# Patient Record
Sex: Male | Born: 1985 | Race: Black or African American | Hispanic: No | Marital: Married | Smoking: Current some day smoker
Health system: Southern US, Community
[De-identification: ages and names within clinical notes are randomized; demographics above are authoritative.]

## PROBLEM LIST (undated history)

## (undated) HISTORY — PX: KNEE SURGERY: SHX244

---

## 2019-01-09 ENCOUNTER — Emergency Department (HOSPITAL_COMMUNITY)

## 2019-01-09 ENCOUNTER — Other Ambulatory Visit: Payer: Self-pay

## 2019-01-09 ENCOUNTER — Encounter (HOSPITAL_COMMUNITY): Payer: Self-pay

## 2019-01-09 ENCOUNTER — Emergency Department (HOSPITAL_COMMUNITY)
Admission: EM | Admit: 2019-01-09 | Discharge: 2019-01-09 | Disposition: A | Discharge status: Home / Self Care | Attending: Emergency Medicine | Admitting: Emergency Medicine

## 2019-01-09 DIAGNOSIS — Z720 Tobacco use: Secondary | ICD-10-CM | POA: Diagnosis not present

## 2019-01-09 DIAGNOSIS — J301 Allergic rhinitis due to pollen: Secondary | ICD-10-CM | POA: Insufficient documentation

## 2019-01-09 DIAGNOSIS — R0981 Nasal congestion: Secondary | ICD-10-CM | POA: Diagnosis present

## 2019-01-09 DIAGNOSIS — R0600 Dyspnea, unspecified: Secondary | ICD-10-CM | POA: Diagnosis not present

## 2019-01-09 LAB — GROUP A STREP BY PCR: Group A Strep by PCR: NOT DETECTED

## 2019-01-09 NOTE — ED Provider Notes (Signed)
[facility]-EMERGENCY DEPT Provider Note   CSN: [id number] Arrival date & time: 01/09/19  1041    History   Chief Complaint Chief Complaint  Patient presents with  . Nasal Congestion  . Allergies    HPI Adam West is a 33 y.o. male.     Patient is a 33 year old male with past medical history of seasonal allergies who presents in the emergency department for shortness of breath and nasal congestion.  Patient reports that about 1 week ago he began to feel short of breath with exertion.  Reports that he has been having his usual seasonal allergy symptoms for the last month or 2.  Reports he takes daily Allegra for this.  Reports nasal congestion with some sneezing and dry cough.  He denies any sick contacts but does report that he traveled to [place] recently.  He reports that he just wanted to get checked out in the context of the coronavirus pandemic.  Denies any fever, chest pain, leg swelling, leg pain.  He reports that the he feels this is probably just his allergies and he might be overreacting but wanted to be evaluated anyway     History reviewed. No pertinent past medical history.  There are no active problems to display for this patient.   Past Surgical History:  Procedure Laterality Date  . KNEE SURGERY Right         Home Medications    Prior to Admission medications   Not on File    Family History No family history on file.  Social History Social History   Tobacco Use  . Smoking status: Current Some Day Smoker  Substance Use Topics  . Alcohol use: Yes    Comment: socially  . Drug use: Yes    Types: Marijuana     Allergies   Patient has no known allergies.   Review of Systems Review of Systems  Constitutional: Negative for chills and fever.  HENT: Positive for sneezing and sore throat. Negative for congestion, ear discharge, ear pain, nosebleeds, postnasal drip, rhinorrhea, trouble swallowing and voice change.    Eyes: Negative for pain and visual disturbance.  Respiratory: Positive for cough and shortness of breath.   Cardiovascular: Negative for chest pain and palpitations.  Gastrointestinal: Negative for abdominal pain, diarrhea, nausea and vomiting.  Genitourinary: Negative for dysuria and hematuria.  Musculoskeletal: Negative for arthralgias, back pain and myalgias.  Skin: Negative for color change and rash.  Allergic/Immunologic: Negative for immunocompromised state.  Neurological: Negative for dizziness, seizures, syncope and headaches.  All other systems reviewed and are negative.    Physical Exam Updated Vital Signs BP (!) 136/94 (BP Location: Left Arm)   Pulse 67   Temp 98.1 F (36.7 C) (Oral)   Resp 14   Ht 6\' 4"  (1.93 m)   Wt 99.8 kg   SpO2 95% Comment: while pt is ambulating  BMI 26.78 kg/m   Physical Exam Vitals signs and nursing note reviewed.  Constitutional:      Appearance: Normal appearance.  HENT:     Head: Normocephalic.     Nose: Congestion present.     Mouth/Throat:     Mouth: Mucous membranes are moist.     Pharynx: Oropharynx is clear. No oropharyngeal exudate or posterior oropharyngeal erythema.  Eyes:     Conjunctiva/sclera: Conjunctivae normal.  Cardiovascular:     Rate and Rhythm: Normal rate and regular rhythm.  Pulmonary:     Effort: Pulmonary effort is normal. No respiratory  distress.     Breath sounds: Normal breath sounds.  Abdominal:     General: Abdomen is flat. Bowel sounds are normal.  Lymphadenopathy:     Cervical: Cervical adenopathy present.  Skin:    General: Skin is warm and dry.     Capillary Refill: Capillary refill takes less than 2 seconds.  Neurological:     Mental Status: He is alert.  Psychiatric:        Mood and Affect: Mood normal.      ED Treatments / Results  Labs (all labs ordered are listed, but only abnormal results are displayed) Labs Reviewed  GROUP A STREP BY PCR    EKG None  Radiology Dg Chest  Portable 1 View  Result Date: 01/09/2019 CLINICAL DATA:  Short of breath EXAM: PORTABLE CHEST 1 VIEW COMPARISON:  None. FINDINGS: The heart size and mediastinal contours are within normal limits. Both lungs are clear. The visualized skeletal structures are unremarkable. IMPRESSION: No active disease. Electronically Signed   By: Marlan Palau M.D.   On: 01/09/2019 11:56    Procedures Procedures (including critical care time)  Medications Ordered in ED Medications - No data to display   Initial Impression / Assessment and Plan / ED Course  I have reviewed the triage vital signs and the nursing notes.  Pertinent labs & imaging results that were available during my care of the patient were reviewed by me and considered in my medical decision making (see chart for details).  Clinical Course as of Jan 09 1256  Wed Jan 09, 2019  1132 Adam West was evaluated in Emergency Department on 01/09/2019 for the symptoms described in the history of present illness. He was evaluated in the context of the global COVID-19 pandemic, which necessitated consideration that the patient might be at risk for infection with the SARS-CoV-2 virus that causes COVID-19. Institutional protocols and algorithms that pertain to the evaluation of patients at risk for COVID-19 are in a state of rapid change based on information released by regulatory bodies including the CDC and federal and state organizations. These policies and algorithms were followed during the patient's care in the ED.      [KM]  1254 Unlikely to be COVID.  However I did advise the patient that he should self quarantine.  Instructions were given.  Chest x-ray and strep were negative.  I think that his symptoms are likely related to allergies.  Will have him continue Allegra and he was told to start taking Flonase.   [KM]    Clinical Course User Index [KM] Arlyn Dunning, PA-C       Based on review of vitals, medical screening exam, lab work  and/or imaging, there does not appear to be an acute, emergent etiology for the patient's symptoms. Counseled pt on good return precautions and encouraged both PCP and ED follow-up as needed.  Prior to discharge, I also discussed incidental imaging findings with patient in detail and advised appropriate, recommended follow-up in detail.  Clinical Impression: 1. Seasonal allergic rhinitis due to pollen   2. Dyspnea, unspecified type     Disposition: Discharge  Prior to providing a prescription for a controlled substance, I independently reviewed the patient's recent prescription history on the [place] Controlled Substance Reporting System. The patient had no recent or regular prescriptions and was deemed appropriate for a brief, less than 3 day prescription of narcotic for acute analgesia.  This note was prepared with assistance of Conservation officer, historic buildings. Occasional  wrong-word or sound-a-like substitutions may have occurred due to the inherent limitations of voice recognition software.   Final Clinical Impressions(s) / ED Diagnoses   Final diagnoses:  Seasonal allergic rhinitis due to pollen  Dyspnea, unspecified type    ED Discharge Orders    None       Jeral PinchMcLean, Kasyn Stouffer A, PA-C 01/09/19 1257    Benjiman CorePickering, Nathan, MD 01/09/19 1426

## 2019-01-09 NOTE — ED Notes (Signed)
Pt sts he traveled to [place] 2 weeks ago

## 2019-01-09 NOTE — ED Triage Notes (Signed)
Pt states that he has bene having some "minor troubles" catching his breath. Pt states he has a hx of allergies, and has been taking allergy meds. Pt states some congestion, runny nose, headache.

## 2019-01-09 NOTE — ED Notes (Signed)
ED Provider at bedside. 

## 2019-01-09 NOTE — Discharge Instructions (Addendum)
Your strep test was normal and your chest x-ray was normal.  Your symptoms are likely related to seasonal allergies.  However, to be safe you should quarantine yourself for 7 days from the onset of your symptoms AND until you are fever free for greater than 72 hours.  Please return if you have increasing trouble breathing.  Please continue her Allegra and start over-the-counter Flonase nasal spray

## 2019-01-10 ENCOUNTER — Emergency Department (HOSPITAL_COMMUNITY)
Admission: EM | Admit: 2019-01-10 | Discharge: 2019-01-10 | Disposition: A | Discharge status: Home / Self Care | Attending: Emergency Medicine | Admitting: Emergency Medicine

## 2019-01-10 ENCOUNTER — Other Ambulatory Visit: Payer: Self-pay

## 2019-01-10 ENCOUNTER — Encounter (HOSPITAL_COMMUNITY): Payer: Self-pay

## 2019-01-10 DIAGNOSIS — F419 Anxiety disorder, unspecified: Secondary | ICD-10-CM | POA: Insufficient documentation

## 2019-01-10 DIAGNOSIS — F17200 Nicotine dependence, unspecified, uncomplicated: Secondary | ICD-10-CM | POA: Insufficient documentation

## 2019-01-10 DIAGNOSIS — R030 Elevated blood-pressure reading, without diagnosis of hypertension: Secondary | ICD-10-CM | POA: Diagnosis not present

## 2019-01-10 DIAGNOSIS — J302 Other seasonal allergic rhinitis: Secondary | ICD-10-CM | POA: Diagnosis not present

## 2019-01-10 DIAGNOSIS — T7840XA Allergy, unspecified, initial encounter: Secondary | ICD-10-CM

## 2019-01-10 MED ORDER — HYDROXYZINE HCL 25 MG PO TABS: 25.0000 mg | ORAL_TABLET | Freq: Three times a day (TID) | ORAL | 0 refills | Status: AC | PRN

## 2019-01-10 NOTE — ED Triage Notes (Signed)
Pt states that he first had a panic attack two months ago because his heart rate  Increased but he didn't know why, this week he's had the same feeling, he's not on any meds and really can't place anything that has happened, he states he's out of work right now because of COVID and he was in the [employer]

## 2019-01-10 NOTE — ED Provider Notes (Signed)
[facility]-EMERGENCY DEPT Provider Note   CSN: [id number] Arrival date & time: 01/10/19  [id number]    History   Chief Complaint Chief Complaint  Patient presents with  . Anxiety    HPI Adam West is a 33 y.o. male.     Patient c/o intermittent feelings of anxiety/stress, especially over the course of the past couple months. Symptoms gradual onset,  intermittent/waxing/waning, recurrent. States earlier this AM was having anxiety episode, trouble sleeping, awoke at 4 am, so came to ED. He notes hx same in past, previously seeing therapist in [place]. States recently moved here. 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 Spouse is in [employer], away on assignment. Denies acute or recent stressor/event. Denies depression or any thoughts of self harm. No prescription med use. Normal appetite. No nvd. No wt change. Some trouble sleeping at night. States only recent physical symptoms are related to allergies, nasal congestion - was in ED yesterday for same - reports strep test neg and cxr neg.     The history is provided by the patient.  Anxiety  Pertinent negatives include no chest pain, no abdominal pain, no headaches and no shortness of breath.    History reviewed. No pertinent past medical history.  There are no active problems to display for this patient.   Past Surgical History:  Procedure Laterality Date  . KNEE SURGERY Right         Home Medications    Prior to Admission medications   Not on File    Family History History reviewed. No pertinent family history.  Social History Social History   Tobacco Use  . Smoking status: Current Some Day Smoker  . Smokeless tobacco: Never Used  Substance Use Topics  . Alcohol use: Yes    Comment: socially  . Drug use: Yes    Types: Marijuana     Allergies   Patient has no known allergies.   Review of Systems Review of Systems  Constitutional:  Negative for fever.  HENT: Positive for congestion and rhinorrhea. Negative for sore throat.   Eyes: Negative for redness.  Respiratory: Negative for cough and shortness of breath.   Cardiovascular: Negative for chest pain.  Gastrointestinal: Negative for abdominal pain, diarrhea and vomiting.  Genitourinary: Negative for flank pain.  Musculoskeletal: Negative for neck pain and neck stiffness.  Skin: Negative for rash.  Neurological: Negative for headaches.  Hematological: Does not bruise/bleed easily.  Psychiatric/Behavioral: Negative for confusion and suicidal ideas. The patient is nervous/anxious.      Physical Exam Updated Vital Signs BP (!) 144/105 (BP Location: Right Arm)   Pulse 68   Temp 98.3 F (36.8 C) (Oral)   Resp 20   Ht 1.93 m (6\' 4" )   Wt 99.8 kg   SpO2 100%   BMI 26.78 kg/m   Physical Exam Vitals signs and nursing note reviewed.  Constitutional:      Appearance: Normal appearance. He is well-developed.  HENT:     Head: Atraumatic.     Nose: Nose normal.     Mouth/Throat:     Mouth: Mucous membranes are moist.     Pharynx: Oropharynx is clear.  Eyes:     General: No scleral icterus.    Conjunctiva/sclera: Conjunctivae normal.     Pupils: Pupils are equal, round, and reactive to light.  Neck:     Musculoskeletal: Normal range of motion and neck supple. No neck  rigidity.     Trachea: No tracheal deviation.     Comments: No thyromegaly or tenderness.  Cardiovascular:     Rate and Rhythm: Normal rate and regular rhythm.     Pulses: Normal pulses.     Heart sounds: Normal heart sounds. No murmur. No friction rub. No gallop.   Pulmonary:     Effort: Pulmonary effort is normal. No accessory muscle usage or respiratory distress.     Breath sounds: Normal breath sounds.  Abdominal:     General: Bowel sounds are normal. There is no distension.     Palpations: Abdomen is soft.     Tenderness: There is no abdominal tenderness.  Genitourinary:    Comments:  No cva tenderness. Musculoskeletal:        General: No swelling.  Skin:    General: Skin is warm and dry.     Findings: No rash.  Neurological:     Mental Status: He is alert.     Comments: Alert, speech clear. Steady gait.   Psychiatric:        Mood and Affect: Mood normal.     Comments: Normal mood/affect. Conversant. No thoughts of harm to self or others. No delusions or hallucinations. No psychosis.       ED Treatments / Results  Labs (all labs ordered are listed, but only abnormal results are displayed) Labs Reviewed - No data to display  EKG None  Radiology Dg Chest Portable 1 View  Result Date: 01/09/2019 CLINICAL DATA:  Short of breath EXAM: PORTABLE CHEST 1 VIEW COMPARISON:  None. FINDINGS: The heart size and mediastinal contours are within normal limits. Both lungs are clear. The visualized skeletal structures are unremarkable. IMPRESSION: No active disease. Electronically Signed   By: Marlan Palau M.D.   On: 01/09/2019 11:56    Procedures Procedures (including critical care time)  Medications Ordered in ED Medications - No data to display   Initial Impression / Assessment and Plan / ED Course  I have reviewed the triage vital signs and the nursing notes.  Pertinent labs & imaging results that were available during my care of the patient were reviewed by me and considered in my medical decision making (see chart for details).   Reviewed nursing notes and prior charts for additional history.   Patient seen at another facility for similar symptoms last month - lab workup then largely unremarkable. cxr from yesterday normal.  Pt currently denies any physical symptoms, other than mild allergy symptoms.   Will give rx vistaril prn anxiety. Discussed outpatient counseling/therapist, as well as pcp f/u.  Pt currently appears stable for d/c.    Final Clinical Impressions(s) / ED Diagnoses   Final diagnoses:  None    ED Discharge Orders    None        Cathren Laine, MD 01/10/19 [id number]

## 2019-01-10 NOTE — Discharge Instructions (Addendum)
It was our pleasure to provide your ER care today - we hope that you feel better.  You may try hydroxyzine as need for anxiety...may make drowsy, no driving when taking.   Follow up with primary care doctor in the next 1-2 weeks - also have blood pressure rechecked then, as it is high today.   Also follow up with counselor/therapist in the next couple weeks.  For mental health issues and/or crisis, you may also go to [facility]   Return to ER if worse, new symptoms, high fevers, trouble breathing, other concern.

## 2019-01-15 ENCOUNTER — Ambulatory Visit (INDEPENDENT_AMBULATORY_CARE_PROVIDER_SITE_OTHER): Admitting: Licensed Clinical Social Worker

## 2019-01-15 ENCOUNTER — Other Ambulatory Visit: Payer: Self-pay

## 2019-01-15 DIAGNOSIS — F411 Generalized anxiety disorder: Secondary | ICD-10-CM

## 2019-01-15 DIAGNOSIS — F4323 Adjustment disorder with mixed anxiety and depressed mood: Secondary | ICD-10-CM

## 2019-01-15 NOTE — Progress Notes (Signed)
Virtual Visit via Video Note  I connected with Adam West on 01/15/19 at  2:00 PM EDT by a video enabled telemedicine application and verified that I am speaking with the correct person using two identifiers.   I discussed the limitations of evaluation and management by telemedicine and the availability of in person appointments. The patient expressed understanding and agreed to proceed.  I discussed the assessment and treatment plan with the patient. The patient was provided an opportunity to ask questions and all were answered. The patient agreed with the plan and demonstrated an understanding of the instructions.   The patient was advised to call back or seek an in-person evaluation if the symptoms worsen or if the condition fails to improve as anticipated.   Comprehensive Clinical Assessment (CCA) Note  01/15/2019 Adam West [id number]  Visit Diagnosis:      ICD-10-CM   1. Adjustment disorder with mixed anxiety and depressed mood F43.23   2. Generalized anxiety disorder F41.1   R/O Major Depressive Disorder Single Episode, PTSD    CCA Part One  Part One has been completed on paper by the patient.  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  Client is a 33 year old AA male presented as a referral for anxiety. Per ED note from 01/09/18 client identified intermittent feelings of anxiety/stress increasing over the past few months, gradual onset with changes in intensity. Client recently moved from [place] where he was working with a Paramedic. Client is D/C from [employer] and now working in Consulting civil engineer. Client spouse, separated, is currently in the military currently away. Current stressors include money due to being furloughed, recent career changes and failing tests for job transitions, current separation, health, and military transition to civilian life rather than re-joining [employer], family strained relationship with father. Symptoms starting in 2014, went away and re-started regularly in 2019. Currently concerns per patient questionnaire include anxiety, insomnia, racing thoughts, irritability, excessive worrying, low motivation, and poor concentration. Client was recently prescribed Hydroxyzine which he takes as needed at night. Client is separated with no children, currently living with sister, brother in law, and niece.  Client previously in [facility] October 2008-October 2016. Client notes combat ([place] 36 months 2010-2015) had 'a lot of different' effects on him. Client drinks alcohol weekly 2 drinks per sitting, 2-3 times per week. Client smokes marijuana 3-4 times weekly, last use 2 weeks ago. Client reports decrease in cannabis use due to panic attacks. Client denies history of withdrawal or treatment for SUD. Client reports feeling anxious when his heart rate raises or he feels overheated and worries he will have a panic attack. Client reports 'have my PTSD under wraps' and denies  nightmares/flashbacks or excessive hypervigilance. Client identifies some frustration and anxiety  resulting from not being where he wants to be in life, and multiple stressors occurring on top of each other (separation, fourlough, career changes, housing changes, not being able to re-join [employer].)  Mental Health Symptoms Depression:  Depression: Sleep (too much or little), Increase/decrease in appetite('what did i do to deserve this')  Mania:  Mania: N/A  Anxiety:   Anxiety: Difficulty concentrating, Sleep, Tension, Irritability, Worrying(sleep: few hours then feeling awake)  Psychosis:  Psychosis: N/A  Trauma:  Trauma: Avoids reminders of event, Hypervigilance, Emotional numbing(hx of PTSD dx with which client did not agree)  Obsessions:  Obsessions: N/A  Compulsions:  Compulsions: N/A  Inattention:  Inattention: N/A  Hyperactivity/Impulsivity:  Hyperactivity/Impulsivity: N/A  Oppositional/Defiant Behaviors:  Oppositional/Defiant Behaviors: N/A  Borderline Personality:     Other Mood/Personality Symptoms:      Mental Status Exam Appearance and self-care  Stature:  Stature: Average  Weight:  Weight: Average weight  Clothing:  Clothing: Casual  Grooming:  Grooming: Normal  Cosmetic use:  Cosmetic Use: Age appropriate  Posture/gait:  Posture/Gait: Normal  Motor activity:  Motor Activity: Not Remarkable  Sensorium  Attention:  Attention: Normal  Concentration:  Concentration: Normal  Orientation:  Orientation: X5  Recall/memory:  Recall/Memory: Normal  Affect and Mood  Affect:  Affect: Anxious  Mood:  Mood: Anxious  Relating  Eye contact:  Eye Contact: Normal  Facial expression:  Facial Expression: Responsive  Attitude toward examiner:  Attitude Toward Examiner: Cooperative  Thought and Language  Speech flow: Speech Flow: Normal  Thought content:  Thought Content: Appropriate to mood and circumstances  Preoccupation:  Preoccupations: Other (Comment)('multiple failures')  Hallucinations:  Hallucinations: Other (Comment)(N/A)  Organization:     Company secretary  of Knowledge:  Fund of Knowledge: Average  Intelligence:  Intelligence: Average  Abstraction:  Abstraction: Normal  Judgement:  Judgement: Chief Financial Officer:  Reality Testing: Adequate  Insight:     Decision Making:     Social Functioning  Social Maturity:  Social Maturity: Responsible  Social Judgement:  Social Judgement: Normal  Stress  Stressors:  Stressors: Family conflict, Housing, Arts administrator, Transitions, Work  Coping Ability:  Coping Ability: Engineer, agricultural Deficits:     Supports:      Family and Psychosocial History: Family history Marital status: Separated Does patient have children?: No  Childhood History:  Childhood History By whom was/is the patient raised?: Psychologist, occupational and step-parent, Mother(spent time with each parent (father married for 3rd time)) Additional childhood history information: parents divorce when 7; fond memories of growing up; distinct memory of the day dad left Description of patient's relationship with caregiver when they were a child: mother more strict and more religious; questioning parenting growing up Patient's description of current relationship with people who raised him/her: tension with father currently How were you disciplined when you got in trouble as a child/adolescent?: HS trouble in [facility], moved to [place] with father Does patient have siblings?: Yes Number of Siblings: 2 Description of patient's current relationship with siblings: close with sisters Did patient suffer any verbal/emotional/physical/sexual abuse as a child?: No Did patient suffer from severe childhood neglect?: No(not stable with mom growing up) Has patient ever been sexually abused/assaulted/raped as an adolescent or adult?: No Was the patient ever a victim of a crime or a disaster?: No Witnessed domestic violence?: No Has patient been effected by domestic violence as an adult?: No  CCA Part Two B  Employment/Work Situation:  Employment / Work  Situation Employment situation: Unemployed(fourloughed) Patient's job has been impacted by current illness: No What is 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 Where was the patient employed at that time?: on first deployment: 'hot headed' physical altercations resulting in loss of rank Did You Receive Any Psychiatric Treatment/Services While in the Military?: No Are There Guns or Other Weapons in Your Home?: Yes Are These Weapons Safely Secured?: Yes  Education: Education Did Garment/textile technologistYou Graduate From [employer] School?: Yes Did Theme park managerYou Attend College?: No Did You Have An Individualized Education Program (IIEP): No Did You Have Any Difficulty At [employer] EnergySchool?: No  Religion: Religion/Spirituality Are You A Religious Person?: Yes  Leisure/Recreation: Leisure / Recreation Leisure and Hobbies: gym, [employer] Scriptsmotorcyles, videogames  Exercise/Diet: Exercise/Diet Do You Exercise?: Yes What Type of Exercise Do You Do?: (currently limited due to knee/ankle energy) How Many Times a Week Do You Exercise?: Daily Have You Gained or Lost A Significant Amount of Weight in the Past Six Months?: No Do You Follow a Special Diet?: No Do You Have Any Trouble Sleeping?: No  CCA Part Two C  Alcohol/Drug Use: Alcohol / Drug Use Prescriptions: hydroxyzine History of alcohol / drug use?: Yes Substance #1 Name of Substance 1: alcohol 1 - Age of First Use: 21 1 - Amount (size/oz): 1-2 drinks 1 - Frequency: 2-3 x weekly Substance #2 Name of Substance 2: marijuana 2 - Age of First Use: 30 2 - Amount (size/oz): 2-3x weekly; max use multiple times daily 2 - Duration: 2 years 2 - Last Use / Amount: 2-3 weeks ago      CCA Part Three  ASAM's:  Six Dimensions of Multidimensional Assessment  Dimension 1:  Acute Intoxication and/or Withdrawal Potential:   1  Dimension 2:  Biomedical Conditions and Complications:   1  Dimension 3:  Emotional, Behavioral, or Cognitive Conditions and  Complications:   2  Dimension 4:  Readiness to Change:   1  Dimension 5:  Relapse, Continued use, or Continued Problem Potential:   1  Dimension 6:  Recovery/Living Environment:   1   Substance use Disorder (SUD)   SUD not of clinical focus at this time. Social Function:  Social Functioning Social Maturity: Responsible Social Judgement: Normal  Stress:  Stress Stressors: Family conflict, Housing, Money, Transitions, Work Coping Ability: Resilient Patient Takes Medications The Way The Doctor Instructed?: Yes Priority Risk: Low Acuity  Risk Assessment- Self-Harm Potential: Risk Assessment For Self-Harm Potential Thoughts of Self-Harm: No current thoughts  Risk Assessment -Dangerous to Others Potential: Risk Assessment For Dangerous to Others Potential Method: No Plan  DSM5 Diagnoses: Adjustment Disorder with Mixed Anxiety and Depressed mood Generalized Anxiety vs Panic Disorder R/O ptsd, MDD Single Episode  Recommendations for Services/Supports/Treatments: Recommendations for Services/Supports/Treatments Recommendations For Services/Supports/Treatments: Individual Therapy, Medication Management  Treatment Plan Summary: OP Treatment Plan Summary: Manage physical symptoms of anxiety, decrease panic attacks to less than 1x per week  Client provided verbal agreement on treatment plan goals.  Referrals to Alternative Service(s): Referred to Alternative Service(s):   Place:   Date:   Time:    Referred to Alternative Service(s):   Place:   Date:   Time:    Referred to Alternative Service(s):   Place:   Date:   Time:    Referred to Alternative Service(s):   Place:   Date:   Time:     Harlon DittyKarissa A Braysen Cloward, LCSW

## 2019-01-30 ENCOUNTER — Ambulatory Visit (INDEPENDENT_AMBULATORY_CARE_PROVIDER_SITE_OTHER): Admitting: Licensed Clinical Social Worker

## 2019-01-30 ENCOUNTER — Other Ambulatory Visit: Payer: Self-pay

## 2019-01-30 DIAGNOSIS — F411 Generalized anxiety disorder: Secondary | ICD-10-CM

## 2019-01-31 ENCOUNTER — Other Ambulatory Visit: Payer: Self-pay

## 2019-01-31 ENCOUNTER — Emergency Department (HOSPITAL_COMMUNITY)

## 2019-01-31 ENCOUNTER — Encounter (HOSPITAL_COMMUNITY): Payer: Self-pay

## 2019-01-31 ENCOUNTER — Emergency Department (HOSPITAL_COMMUNITY)
Admission: EM | Admit: 2019-01-31 | Discharge: 2019-01-31 | Disposition: A | Discharge status: Home / Self Care | Attending: Emergency Medicine | Admitting: Emergency Medicine

## 2019-01-31 DIAGNOSIS — F17210 Nicotine dependence, cigarettes, uncomplicated: Secondary | ICD-10-CM | POA: Diagnosis not present

## 2019-01-31 DIAGNOSIS — R0789 Other chest pain: Secondary | ICD-10-CM | POA: Diagnosis not present

## 2019-01-31 DIAGNOSIS — R079 Chest pain, unspecified: Secondary | ICD-10-CM | POA: Diagnosis present

## 2019-01-31 DIAGNOSIS — R002 Palpitations: Secondary | ICD-10-CM | POA: Diagnosis not present

## 2019-01-31 LAB — CBC
HCT: 45.6 % (ref 39.0–52.0)
Hemoglobin: 15.3 g/dL (ref 13.0–17.0)
MCH: 31.8 pg (ref 26.0–34.0)
MCHC: 33.6 g/dL (ref 30.0–36.0)
MCV: 94.8 fL (ref 80.0–100.0)
Platelets: 240 10*3/uL (ref 150–400)
RBC: 4.81 MIL/uL (ref 4.22–5.81)
RDW: 12 % (ref 11.5–15.5)
WBC: 4.2 10*3/uL (ref 4.0–10.5)
nRBC: 0 % (ref 0.0–0.2)

## 2019-01-31 LAB — BASIC METABOLIC PANEL
Anion gap: 9 (ref 5–15)
BUN: 11 mg/dL (ref 6–20)
CO2: 27 mmol/L (ref 22–32)
Calcium: 9.7 mg/dL (ref 8.9–10.3)
Chloride: 103 mmol/L (ref 98–111)
Creatinine, Ser: 0.97 mg/dL (ref 0.61–1.24)
GFR calc Af Amer: >60 mL/min (ref >60)
GFR calc non Af Amer: >60 mL/min (ref >60)
Glucose, Bld: 90 mg/dL (ref 70–99)
Potassium: 4 mmol/L (ref 3.5–5.1)
Sodium: 139 mmol/L (ref 135–145)

## 2019-01-31 LAB — TROPONIN I: Troponin I: <0.03 ng/mL (ref <0.03)

## 2019-01-31 MED ORDER — SODIUM CHLORIDE 0.9% FLUSH: 3.0000 mL | Freq: Once | INTRAVENOUS | Status: DC

## 2019-01-31 NOTE — Progress Notes (Signed)
Virtual Visit via Video Note  I connected with Adam West on 01/30/2019 at  2:00 PM EDT by a video enabled telemedicine application and verified that I am speaking with the correct person using two identifiers.   I discussed the limitations of evaluation and management by telemedicine and the availability of in person appointments. The patient expressed understanding and agreed to proceed.  I discussed the assessment and treatment plan with the patient. The patient was provided an opportunity to ask questions and all were answered. The patient agreed with the plan and demonstrated an understanding of the instructions.   The patient was advised to call back or seek an in-person evaluation if the symptoms worsen or if the condition fails to improve as anticipated.  I provided 45 minutes of non-face-to-face time during this encounter.   Harlon DittyKarissa A , LCSW  Virtual Visit via Telephone Note  I connected with Adam West on 01/30/2019 at  2:00 PM EDT by telephone and verified that I am speaking with the correct person using two identifiers.   I discussed the limitations, risks, security and privacy concerns of performing an evaluation and management service by telephone and the availability of in person appointments. I also discussed with the patient that there may be a patient responsible charge related to this service. The patient expressed understanding and agreed to proceed.  I discussed the assessment and treatment plan with the patient. The patient was provided an opportunity to ask questions and all were answered. The patient agreed with the plan and demonstrated an understanding of the instructions.   The patient was advised to call back or seek an in-person evaluation if the symptoms worsen or if the condition fails to improve as anticipated.  I provided 45 minutes of non-face-to-face time during this encounter.   Harlon DittyKarissa A , LCSW    THERAPIST PROGRESS  NOTE  Session Time: 2pm-2:45pm  Participation Level: Active  Behavioral Response: NAAlertAnxious and Euthymic  Type of Therapy: Individual Therapy  Treatment Goals addressed: Coping  Interventions: CBT, DBT and Supportive  Summary: Adam West is a 33 y.o. male who presents with anxiety. Client reports having a panic attack while on vacation with friends. Client states he coped with this by packing, stating having a task is helpful for managing his panic in the moment. Client reports being more calm under pressure. He also did not want to 'ruin' his friends vacation plans fishing, though also reported his friends were supportive of him. Client notes feeling unsettled due to being in between work and training, though he has plans to go back to studying for his computer exams, but at a slower pace. Client changed topics frequently throughout session. Client discussed wanting to have discussions about the relationship with his father but voiced concerns about step-mother always being around. Client states he does find support from his sister but would like to become more independent, though is unsure which direction he would like to go. Client continues to deny symptoms of PTSD though does report increasing poor sleep and vivid dreams. Client is looking forward to multiple medical appointments as he reports not meeting with medical providers since discharge from the [employer] [facility]. Client request scheduling an appointment with a psychiatrist.   Suicidal/Homicidal: Nowithout intent/plan  Therapist Response: Clinician checked in with client, assessing for SI/HI/psychosis and overall level of functioning. Clinician processed with client panic attack while on vacation with friends and related thoughts and feelings. Clinician provided client with relaxation skills of paced breathing and progressive muscle  relaxation to address physical symptoms of anxiety. Clinician utilized socratic questioning to challenge  mind reading thoughts from client. Client is receptive to coping skills.  Plan: Return again in 2-3 weeks with new provider. Client was made aware of chang in provider and was receptive.   Diagnosis: Axis I: Generalized Anxiety Disorder      Harlon Ditty, LCSW 01/31/2019

## 2019-01-31 NOTE — Discharge Instructions (Addendum)
As discussed, your evaluation today has been largely reassuring.  But, it is important that you monitor your condition carefully, and do not hesitate to return to the ED if you develop new, or concerning changes in your condition.  Otherwise, please follow-up with your physician for appropriate ongoing care.  Please be sure to discuss today's annual recent evaluation for chest pain, and palpitations. You may also consider Holter monitoring for additional information about your palpitations.

## 2019-01-31 NOTE — ED Notes (Signed)
Patient transported to x-ray. ?

## 2019-01-31 NOTE — ED Triage Notes (Signed)
Patient c/o intermittent mid chest pain x 3 days. Patient denies any SOB, diaphoresis, or radiating of pain. Patient states he was told last week that he had an irregular heart rhythm. Patient was seen on 01/10/19 for anxiety.

## 2019-01-31 NOTE — ED Provider Notes (Signed)
[facility]-EMERGENCY DEPT Provider Note   CSN: [id number] Arrival date & time: 01/31/19  1143    History   Chief Complaint Chief Complaint  Patient presents with  . Chest Pain    HPI Antionio West is a 33 y.o. male.     HPI Patient presents with chest pain, palpitations. Patient notes a history of anxiety, and prior evaluations for chest pain. Last evaluation was about 1 month ago, he notes that today, he has had several episodes of chest pain recently, and an episode of palpitations that awoke him from sleep about 10 hours prior to ED arrival. Patient notes that over the past month or so he has made lifestyle changes to reduce his cardiac risk including cessation of marijuana use, minimization of cigarettes, and drinking plenty of fluids. No clear precipitant for this most recent episode, and currently has no palpitations, no pain.  History reviewed. No pertinent past medical history.  There are no active problems to display for this patient.   Past Surgical History:  Procedure Laterality Date  . KNEE SURGERY Right         Home Medications    Prior to Admission medications   Medication Sig Start Date End Date Taking? Authorizing Provider  hydrOXYzine (ATARAX/VISTARIL) 25 MG tablet Take 1 tablet (25 mg total) by mouth every 8 (eight) hours as needed. Patient taking differently: Take 25 mg by mouth every 8 (eight) hours as needed for anxiety.  01/10/19  Yes Cathren Laine, MD    Family History Family History  Problem Relation Age of Onset  . Healthy Mother   . Hypertension Father   . Cancer Father     Social History Social History   Tobacco Use  . Smoking status: Current Some Day Smoker    Types: Cigarettes  . Smokeless tobacco: Never Used  Substance Use Topics  . Alcohol use: Yes    Comment: socially  . Drug use: Not Currently    Types: Marijuana    Comment: last used 12/31/18     Allergies   Patient has no known allergies.   Review of Systems Review of Systems  Constitutional:       Per HPI, otherwise negative  HENT:       Per HPI, otherwise negative  Respiratory:       Per HPI, otherwise negative  Cardiovascular:       Per HPI, otherwise negative  Gastrointestinal: Negative for vomiting.  Endocrine:       Negative aside from HPI  Genitourinary:       Neg aside from HPI   Musculoskeletal:       Per HPI, otherwise negative  Skin: Negative.   Neurological: Negative for syncope.     Physical Exam Updated Vital Signs BP (!) 137/91 (BP Location: Left Arm)   Pulse 76   Temp 98.9 F (37.2 C) (Oral)   Resp 17   Ht 6\' 4"  (1.93 m)   Wt 99.8 kg   SpO2 100%   BMI 26.78 kg/m   Physical Exam Vitals signs and nursing note reviewed.  Constitutional:      General: He is not in acute distress.    Appearance: He is well-developed.  HENT:     Head: Normocephalic and atraumatic.  Eyes:     Conjunctiva/sclera: Conjunctivae normal.  Cardiovascular:     Rate and Rhythm: Normal rate and regular rhythm.  Pulmonary:     Effort: Pulmonary effort is normal. No respiratory distress.  Breath sounds: No stridor.  Abdominal:     General: There is no distension.  Skin:    General: Skin is warm and dry.  Neurological:     Mental Status: He is alert and oriented to person, place, and time.      ED Treatments / Results  Labs (all labs ordered are listed, but only abnormal results are displayed) Labs Reviewed  BASIC METABOLIC PANEL  CBC  TROPONIN I    EKG EKG Interpretation  Date/Time:  Thursday Jan 31 2019 12:05:43 EDT Ventricular Rate:  65 PR Interval:    QRS Duration: 119 QT Interval:  376 QTC Calculation: 376 R Axis:   100 Text Interpretation:  Sinus rhythm Short PR interval Incomplete right bundle branch block T wave abnormality Abnormal ekg Confirmed by Gerhard MunchLockwood, Arvetta Araque [phone]([id number]) on 01/31/2019 12:17:24 PM   Radiology Dg Chest 2 View  Result Date: 01/31/2019 CLINICAL DATA:  Chest  EXAM: CHEST - 2 VIEW COMPARISON:  January 09, 2019. FINDINGS: Lungs are clear. Heart size and pulmonary vascularity are normal. No adenopathy. No pneumothorax. No bone lesions. IMPRESSION: No edema or consolidation. Electronically Signed   By: Bretta BangWilliam  Woodruff III M.D.   On: 01/31/2019 13:07    Procedures Procedures (including critical care time)  Medications Ordered in ED Medications  sodium chloride flush (NS) 0.9 % injection 3 mL (has no administration in time range)     Initial Impression / Assessment and Plan / ED Course  I have reviewed the triage vital signs and the nursing notes.  Pertinent labs & imaging results that were available during my care of the patient were reviewed by me and considered in my medical decision making (see chart for details).  2:12 PM Patient in no distress on repeat exam.  We discussed today's evaluation, recent studies at length, and absent evidence for atypical ACS, no evidence for other acute new pathology, patient will follow-up closely as an outpatient.  Final Clinical Impressions(s) / ED Diagnoses   Final diagnoses:  Atypical chest pain     Gerhard MunchLockwood, Aalaysia Liggins, MD 01/31/19 1413

## 2019-02-06 ENCOUNTER — Encounter (HOSPITAL_COMMUNITY): Payer: Self-pay | Admitting: Psychiatry

## 2019-02-06 ENCOUNTER — Ambulatory Visit (INDEPENDENT_AMBULATORY_CARE_PROVIDER_SITE_OTHER): Admitting: Psychiatry

## 2019-02-06 ENCOUNTER — Other Ambulatory Visit: Payer: Self-pay

## 2019-02-06 DIAGNOSIS — F411 Generalized anxiety disorder: Secondary | ICD-10-CM | POA: Diagnosis not present

## 2019-02-06 DIAGNOSIS — F410 Panic disorder [episodic paroxysmal anxiety] without agoraphobia: Secondary | ICD-10-CM

## 2019-02-06 MED ORDER — AMITRIPTYLINE HCL 25 MG PO TABS: 25.0000 mg | ORAL_TABLET | Freq: Every day | ORAL | 1 refills | Status: AC

## 2019-02-06 NOTE — Progress Notes (Signed)
Virtual Visit via Video Note  I connected with Adam West on 02/06/19 at  1:00 PM EDT by a video enabled telemedicine application and verified that I am speaking with the correct person using two identifiers.   I discussed the limitations of evaluation and management by telemedicine and the availability of in person appointments. The patient expressed understanding and agreed to proceed.  History of Present Illness: Patient is [id number] year old African-American currently unemployed separated man who is referred from urgent care for the management of his anxiety.  Apparently patient has back-to-back 3 ER visits in the past 2 months due to anxiety, panic attack.  He was prescribed hydroxyzine which he is not taking regularly but takes when he feel very anxious.  Patient told that it helps but he continued to feel baseline depressed, anxious but lack of motivation to do things.  Patient reported diagnosed PTSD while he was in [employer] but currently had not seen any psychiatrist or therapist.  Patient told that a lot of stressors going on in his life.  He is going through divorce, living with his sister after felt that he was not welcome at his father's and his stepmother's house.  He he was working at [employer] in [place] but due to COVID-19 has to be furlough and not able to keep his residential place.  Initially he moved to his father's house but after some time he felt he was not welcome decided to [place] to [place].  Patient admitted that he thinks a lot about his past and his future.  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  He has taken multiple times security test but failed and now he has upcoming test again.  He has not schedule the test but he is very worried about his outcome.  He also worried about his general health, relationship, future in general.  Patient told his medics follow part due to financial  issues, lack of communication and admitted not being faithful to his wife.  Patient told his wife is now deployed overseas and they have been separated for past 7 months.  Patient told they have no children.  He had a good history with his sister.  He admitted sometimes crying spells, feeling hopeless and disappointed about his life.  Patient reportedly thinks a lot about his past.  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  He also recall earlier in high school while playing football he injured his right knee and unable to get football scholarship.  Patient reported history of heavy drinking in the past but since he had a panic attacks he has cut down his drinking and not smoke marijuana.  Denies any suicidal thoughts, aggression, mania or any psychosis.  He worried about his general health.  He endorsed racing thoughts, poor sleep, feeling nervous and on edge.  He also admitted multiple to enjoyed as much.  He admitted lack of motivation to do things.  Since the last attack he had few weeks ago he is thinking too much about his life.  Patient reported back to back 3 panic attacks has make him very nervous.  He described these panic attacks with increased heart rate, shortness of breath, headaches and dizziness.  He reported his appetite is okay.  His energy level is fair.  He is  willing to try medication on a regular basis to help his symptoms.    Past psychiatric history; No history of psychiatric inpatient treatment or any suicidal attempt.  No history of physical sexual or verbal abuse.  History of dream and diagnosed PTSD while he was in [employer] but never prescribed any medication.  Seen briefly therapist in 2016.  Denies any history of mania, psychosis.  Had panic attacks and anxiety and seen in the emergency room 3 times since March 2020.  Prescribed hydroxyzine which he takes on and off.  Psychosocial  history; Patient born in [place].  When he was 33 years old with his parents separated.  He was raised by his mother until age 35 and then moved with his father.  Patient has 2 older sister who he get along very well.  Patient admitted history of long relationship which was ended and soon after he got married.  His marriage lasted for 6 years and currently he is separated and going through divorce.  He has no children.  His wife works in [employer].    Military history; Patient has served in [employer] for 9 years.  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  In 2016 he discharged from [employer].  Medical history; No history of seizures, head trauma.  History of right knee pain and rupture ACL.  Alcohol and substance use history; History of heavy drinking and smoking marijuana but claims to be stopping marijuana and cut down drinking since March 2020 after the panic attacks.   Psychiatric Specialty Exam: Physical Exam  ROS  There were no vitals taken for this visit.There is no height or weight on file to calculate BMI.  General Appearance: Casual and appears to be stated age  Eye Contact:  Fair  Speech:  Clear and Coherent  Volume:  Normal  Mood:  Anxious and Dysphoric  Affect:  Constricted and Depressed  Thought Process:  Goal Directed  Orientation:  Full (Time, Place, and Person)  Thought Content:  Rumination  Suicidal Thoughts:  No  Homicidal Thoughts:  No  Memory:  Immediate;   Good Recent;   Good Remote;   Good  Judgement:  Fair  Insight:  Good  Psychomotor Activity:  Normal  Concentration:  Concentration: Fair and Attention Span: Fair  Recall:  Good  Fund of Knowledge:  Good  Language:  Good  Akathisia:  No  Handed:  Right  AIMS (if indicated):     Assets:  Communication Skills Desire for Improvement Resilience Talents/Skills  ADL's:  Intact  Cognition:  WNL  Sleep:   fair      Assessment and  Plan: Patient is 33 year old African-American man who is experiencing anxiety, panic attack.  I reviewed his current medication and psychosocial issues and history.  We discussed to try on a starting dose of antianxiety medicine as he is only taking hydroxyzine when he has a panic attack but is still suffer from anxiety and depression.  I recommend to try amitriptyline 25 mg at bedtime to help his insomnia, anxiety and depression.  Discussed medication side effects and benefits in detail.  I also encourage that he should start therapy for CBT and he agreed.  We will schedule appointment for therapy in our office with [facility].  Discussed safety concern that anytime having active suicidal thoughts or homicidal thoughts and he need to call 911 of the local emergency room.  Follow-up in 3 weeks.  He will continue hydroxyzine as needed for severe anxiety.  Follow Up Instructions:    I discussed the assessment and treatment plan with the patient. The patient was provided an opportunity to ask questions and all were answered. The patient agreed with the plan and demonstrated an understanding of the instructions.   The patient was advised to call back or seek an in-person evaluation if the symptoms worsen or if the condition fails to improve as anticipated.  I provided 55 minutes of non-face-to-face time during this encounter.   Cleotis Nipper, MD

## 2019-02-13 ENCOUNTER — Ambulatory Visit (HOSPITAL_COMMUNITY): Admitting: Licensed Clinical Social Worker

## 2019-02-19 ENCOUNTER — Other Ambulatory Visit: Payer: Self-pay

## 2019-02-19 ENCOUNTER — Ambulatory Visit (INDEPENDENT_AMBULATORY_CARE_PROVIDER_SITE_OTHER): Admitting: Licensed Clinical Social Worker

## 2019-02-19 DIAGNOSIS — F411 Generalized anxiety disorder: Secondary | ICD-10-CM | POA: Diagnosis not present

## 2019-02-22 NOTE — Progress Notes (Signed)
Virtual Visit via Telephone Note  I connected with Adam West on 02/19/2019 at  3:00 PM EDT by telephone and verified that I am speaking with the correct person using two identifiers.   I discussed the limitations, risks, security and privacy concerns of performing an evaluation and management service by telephone and the availability of in person appointments. I also discussed with the patient that there may be a patient responsible charge related to this service. The patient expressed understanding and agreed to proceed.   I discussed the assessment and treatment plan with the patient. The patient was provided an opportunity to ask questions and all were answered. The patient agreed with the plan and demonstrated an understanding of the instructions.   The patient was advised to call back or seek an in-person evaluation if the symptoms worsen or if the condition fails to improve as anticipated.  I provided 30 minutes of non-face-to-face time during this encounter.   Olegario Messier, LCSW    THERAPIST PROGRESS NOTE  Session Time: 3pm-3:30pm  Participation Level: Active  Behavioral Response: NAAlertEuthymic  Type of Therapy: Individual Therapy  Treatment Goals addressed: Coping  Interventions: CBT, Motivational Interviewing and Supportive  Summary: Adam West is a 33 y.o. male who presents with Generalized anxiety. Client presented via telehealth, phone due to pandemic and safety restrictions. Client provided updates on frequency and intensity of panic symptoms. Client reports utilizing Progressive muscle relaxation and deep breathing as well as realistic positive self talk to manage any symptoms of anxiety. Client shared the possibility of a contract position overseas in which he is interested. Client believes having a definitive goal and timeline is helpful for maintaining a schedule and managing his anxiety symptoms.    Suicidal/Homicidal: Nowithout  intent/plan  Therapist Response: Clinician checked in with client, assessing for SI/HI/psychosis and overall level of functioning. Clinician followed up with client on level of anxiety, frequency, and intensity. Clinician processed with client the benefits of having a stable job and schedule in managing his anxiety. Clinician encouraged client to continue use of progressive muscle relaxation and breathing skills. Clinician reminded client of TIPP skill to help with the physical effects of acute anxiety symptoms.   Plan: Return again in 2-4 weeks.  Diagnosis: Axis I: Generalized Anxiety Disorder    Olegario Messier, LCSW 02/19/2019

## 2019-03-05 ENCOUNTER — Other Ambulatory Visit: Payer: Self-pay

## 2019-03-05 ENCOUNTER — Ambulatory Visit (HOSPITAL_COMMUNITY): Admitting: Licensed Clinical Social Worker

## 2019-03-06 ENCOUNTER — Other Ambulatory Visit: Payer: Self-pay

## 2019-03-06 ENCOUNTER — Ambulatory Visit (HOSPITAL_COMMUNITY): Admitting: Psychiatry

## 2019-08-14 ENCOUNTER — Other Ambulatory Visit: Payer: Self-pay

## 2019-08-14 ENCOUNTER — Emergency Department (HOSPITAL_COMMUNITY)
Admission: EM | Admit: 2019-08-14 | Discharge: 2019-08-14 | Disposition: A | Discharge status: Home / Self Care | Attending: Emergency Medicine | Admitting: Emergency Medicine

## 2019-08-14 ENCOUNTER — Encounter (HOSPITAL_COMMUNITY): Payer: Self-pay | Admitting: Emergency Medicine

## 2019-08-14 DIAGNOSIS — R0981 Nasal congestion: Secondary | ICD-10-CM | POA: Diagnosis present

## 2019-08-14 DIAGNOSIS — J302 Other seasonal allergic rhinitis: Secondary | ICD-10-CM | POA: Diagnosis not present

## 2019-08-14 DIAGNOSIS — F17210 Nicotine dependence, cigarettes, uncomplicated: Secondary | ICD-10-CM | POA: Diagnosis not present

## 2019-08-14 MED ORDER — FLUTICASONE PROPIONATE 50 MCG/ACT NA SUSP: 1.0000 | Freq: Every day | NASAL | 0 refills | Status: AC

## 2019-08-14 MED ORDER — CETIRIZINE HCL 10 MG PO TABS: 10.0000 mg | ORAL_TABLET | Freq: Every day | ORAL | 0 refills | Status: AC

## 2019-08-14 NOTE — ED Provider Notes (Signed)
[facility]-EMERGENCY DEPT Provider Note   CSN: [id number] Arrival date & time: 08/14/19  1744     History   Chief Complaint Chief Complaint  Patient presents with  . Nasal Congestion  . Sore Throat    HPI Adam West is a 33 y.o. male who presents to the ED today complaining of gradual onset, constant, worsening, nasal congestion x 1-2 months.  She reports he recently moved to the area from [facility] [place] [facility] and started having congestion and intermittent sore throat.  Patient states that he has been using Afrin spray with out much relief.  Patient states that he used to take Allegra in the past but it only helped him intermittently so he stopped taking it.  No recent sick contacts.  No COVID-19 positive exposure.  Patient states he wanted to come in and make sure he does not need a Covid test.  She denies fever, chills, inability to swallow, drooling, trismus, shortness of breath, or any other associated symptoms.       History reviewed. No pertinent past medical history.  There are no active problems to display for this patient.   Past Surgical History:  Procedure Laterality Date  . KNEE SURGERY Right         Home Medications    Prior to Admission medications   Medication Sig Start Date End Date Taking? Authorizing Provider  amitriptyline (ELAVIL) 25 MG tablet Take 1 tablet (25 mg total) by mouth at bedtime. 02/06/19   Arfeen, Phillips Grout, MD  cetirizine (ZYRTEC ALLERGY) 10 MG tablet Take 1 tablet (10 mg total) by mouth daily. 08/14/19 09/13/19  Abdi Husak, PA-C  fluticasone (FLONASE) 50 MCG/ACT nasal spray Place 1 spray into both nostrils daily. 08/14/19 09/13/19  Tanda Rockers, PA-C  hydrOXYzine (ATARAX/VISTARIL) 25 MG tablet Take 1 tablet (25 mg total) by mouth every 8 (eight) hours as needed. Patient taking differently: Take 25 mg by mouth every 8 (eight) hours as needed for anxiety.  01/10/19   Cathren Laine, MD    Family History Family  History  Problem Relation Age of Onset  . Healthy Mother   . Hypertension Father   . Cancer Father   . Anxiety disorder Sister     Social History Social History   Tobacco Use  . Smoking status: Current Some Day Smoker    Types: Cigarettes  . Smokeless tobacco: Never Used  Substance Use Topics  . Alcohol use: Yes    Comment: socially  . Drug use: Not Currently    Types: Marijuana    Comment: last used 12/31/18     Allergies   Patient has no known allergies.   Review of Systems Review of Systems  Constitutional: Negative for chills and fever.  HENT: Positive for congestion and sore throat. Negative for drooling, sinus pressure, sinus pain and trouble swallowing.      Physical Exam Updated Vital Signs BP (!) 133/95   Pulse 62   Temp 98.3 F (36.8 C) (Oral)   Resp 18   SpO2 97%   Physical Exam Vitals signs and nursing note reviewed.  Constitutional:      Appearance: He is not ill-appearing or diaphoretic.  HENT:     Head: Normocephalic and atraumatic.     Right Ear: Tympanic membrane normal.     Left Ear: Tympanic membrane normal.     Nose: Congestion present.     Mouth/Throat:     Mouth: Mucous membranes are moist.     Pharynx:  Uvula midline. No pharyngeal swelling, oropharyngeal exudate or posterior oropharyngeal erythema.  Eyes:     Conjunctiva/sclera: Conjunctivae normal.  Cardiovascular:     Rate and Rhythm: Normal rate and regular rhythm.  Pulmonary:     Effort: Pulmonary effort is normal.     Breath sounds: Normal breath sounds.  Skin:    General: Skin is warm and dry.     Coloration: Skin is not jaundiced.  Neurological:     Mental Status: He is alert.      ED Treatments / Results  Labs (all labs ordered are listed, but only abnormal results are displayed) Labs Reviewed - No data to display  EKG None  Radiology No results found.  Procedures Procedures (including critical care time)  Medications Ordered in ED Medications - No  data to display   Initial Impression / Assessment and Plan / ED Course  I have reviewed the triage vital signs and the nursing notes.  Pertinent labs & imaging results that were available during my care of the patient were reviewed by me and considered in my medical decision making (see chart for details).    33 year old male presents the ED today complaining of multiple months of nasal congestion and irritation in throat.  History of seasonal allergies, not currently on any allergy medication.  States he uses Afrin daily.  Does not appear patient has ever tried Flonase in the past.  Vital signs are stable today.  He is afebrile without tachycardia or tachypnea.  No concerning symptoms for deep space infection of the neck.  His uvula is midline and there is no erythema or edema to posterior oropharyngeal area.  TMs are clear as well.  Patient does sound congested on exam.  Have advised that he discontinue Afrin and start using Zyrtec and Flonase daily.  Patient advised to follow-up with PCP for same.  Have given referral to [facility] for PCP concerns.  Patient is concerned that he could have Covid.  No known COVID-19 positive exposure.  Given his symptoms have been ongoing for multiple months and he is without fever, chills, shortness of breath, cough, diarrhea feel very strongly that this is likely more related to seasonal allergies.  Have advised that if patient wants to get a Covid test that he can go through [place] area.  Patient is in agreement with plan at this time stable for discharge home.  This note was prepared using Dragon voice recognition software and may include unintentional dictation errors due to the inherent limitations of voice recognition software.       Final Clinical Impressions(s) / ED Diagnoses   Final diagnoses:  Seasonal allergies    ED Discharge Orders         Ordered    fluticasone (FLONASE) 50 MCG/ACT nasal spray  Daily     08/14/19 2054     cetirizine (ZYRTEC ALLERGY) 10 MG tablet  Daily     08/14/19 2054           Eustaquio Maize, PA-C 08/14/19 2103    Deno Etienne, DO 08/14/19 2221

## 2019-08-14 NOTE — ED Notes (Signed)
An After Visit Summary was printed and given to the patient. Discharge instructions given and no further questions at this time.  

## 2019-08-14 NOTE — ED Triage Notes (Signed)
Pt reports for couple months having nasal congestion and irritated throat. Reports been using nasal spray for months everday, even though doesn't need to. Wants Covid test.

## 2019-08-14 NOTE — Discharge Instructions (Signed)
Discontinue Afrin usage. Take Zyrtec and Flonase daily to help with your nasal congestion.   Follow up with PCP. You can follow up with [facility] if you do not have a PCP in the area. There is also a 1-800 number on the back of the discharge paperwork to help find a PCP in your area that accepts your insurance.

## 2019-10-20 ENCOUNTER — Encounter (HOSPITAL_COMMUNITY): Payer: Self-pay

## 2019-10-20 ENCOUNTER — Other Ambulatory Visit: Payer: Self-pay

## 2019-10-20 ENCOUNTER — Emergency Department (HOSPITAL_COMMUNITY)
Admission: EM | Admit: 2019-10-20 | Discharge: 2019-10-20 | Disposition: A | Discharge status: Home / Self Care | Attending: Emergency Medicine | Admitting: Emergency Medicine

## 2019-10-20 DIAGNOSIS — Y991 Military activity: Secondary | ICD-10-CM | POA: Insufficient documentation

## 2019-10-20 DIAGNOSIS — Y929 Unspecified place or not applicable: Secondary | ICD-10-CM | POA: Insufficient documentation

## 2019-10-20 DIAGNOSIS — F17210 Nicotine dependence, cigarettes, uncomplicated: Secondary | ICD-10-CM | POA: Diagnosis not present

## 2019-10-20 DIAGNOSIS — Z79899 Other long term (current) drug therapy: Secondary | ICD-10-CM | POA: Diagnosis not present

## 2019-10-20 DIAGNOSIS — M545 Low back pain, unspecified: Secondary | ICD-10-CM

## 2019-10-20 DIAGNOSIS — Y93B9 Activity, other involving muscle strengthening exercises: Secondary | ICD-10-CM | POA: Diagnosis not present

## 2019-10-20 DIAGNOSIS — X500XXA Overexertion from strenuous movement or load, initial encounter: Secondary | ICD-10-CM | POA: Insufficient documentation

## 2019-10-20 MED ORDER — CYCLOBENZAPRINE HCL 10 MG PO TABS: 10.0000 mg | ORAL_TABLET | Freq: Two times a day (BID) | ORAL | 0 refills | Status: AC | PRN

## 2019-10-20 MED ORDER — NAPROXEN 500 MG PO TABS: 500.0000 mg | ORAL_TABLET | Freq: Two times a day (BID) | ORAL | 0 refills | Status: AC

## 2019-10-20 NOTE — ED Triage Notes (Signed)
Pt presents with c/o back pain. Pt reports a hx of back spasms and reports that this feels like that. Pt reports pain is worse with movement. Denies any injury, ambulatory to triage.

## 2019-10-20 NOTE — ED Provider Notes (Signed)
[facility]-EMERGENCY DEPT Provider Note   CSN: [id number] Arrival date & time: 10/20/19  1433     History Chief Complaint  Patient presents with  . Back Pain    Adam West is a 34 y.o. male.  Patient is a 33 y/o male with no significant PMH presenting to the ER for lower back pain for the past 3-4 days. Reports he is in the [employer] and works out a that gym a lot and has had hx of lower back problems in the past. Has been to the chiropractor in the past which was helpful. Denies any inciting event or injury for the return of his back pain although he does state he may have worked out too hard prior to the pain starting. Denies any radiation into the legs, numbness, tingling, saddle anesthesia, fever, chills, dysuria, urinary rentention. Pain is worse with movement and better with rest but does not entirely go away. [facility].        History reviewed. No pertinent past medical history.  There are no problems to display for this patient.   Past Surgical History:  Procedure Laterality Date  . KNEE SURGERY Right        Family History  Problem Relation Age of Onset  . Healthy Mother   . Hypertension Father   . Cancer Father   . Anxiety disorder Sister     Social History   Tobacco Use  . Smoking status: Current Some Day Smoker    Types: Cigarettes  . Smokeless tobacco: Never Used  Substance Use Topics  . Alcohol use: Yes    Comment: socially  . Drug use: Not Currently    Types: Marijuana    Comment: last used 12/31/18    Home Medications Prior to Admission medications   Medication Sig Start Date End Date Taking? Authorizing Provider  amitriptyline (ELAVIL) 25 MG tablet Take 1 tablet (25 mg total) by mouth at bedtime. 02/06/19   Arfeen, Phillips Grout, MD  cetirizine (ZYRTEC ALLERGY) 10 MG tablet Take 1 tablet (10 mg total) by mouth daily. 08/14/19 09/13/19  Tanda Rockers, PA-C    cyclobenzaprine (FLEXERIL) 10 MG tablet Take 1 tablet (10 mg total) by mouth 2 (two) times daily as needed for up to 7 days for muscle spasms. 10/20/19 10/27/19  Ronnie Doss A, PA-C  fluticasone (FLONASE) 50 MCG/ACT nasal spray Place 1 spray into both nostrils daily. 08/14/19 09/13/19  Tanda Rockers, PA-C  hydrOXYzine (ATARAX/VISTARIL) 25 MG tablet Take 1 tablet (25 mg total) by mouth every 8 (eight) hours as needed. Patient taking differently: Take 25 mg by mouth every 8 (eight) hours as needed for anxiety.  01/10/19   Cathren Laine, MD  naproxen (NAPROSYN) 500 MG tablet Take 1 tablet (500 mg total) by mouth 2 (two) times daily. 10/20/19   Arlyn Dunning, PA-C    Allergies    Patient has no known allergies.  Review of Systems   Review of Systems  Constitutional: Negative for chills and fever.  Gastrointestinal: Negative for abdominal pain, nausea and vomiting.  Genitourinary: Negative for decreased urine volume, difficulty urinating and dysuria.  Musculoskeletal: Positive for arthralgias and back pain. Negative for gait problem, joint swelling, myalgias, neck pain and neck stiffness.  Skin: Negative for rash and wound.  Neurological: Negative for weakness and numbness.    Physical Exam Updated Vital Signs BP 136/85 (BP Location: Right Arm)  Pulse 80   Temp 99 F (37.2 C) (Oral)   Resp 18   Ht 6\' 4"  (1.93 m)   Wt 102.1 kg   SpO2 99%   BMI 27.39 kg/m   Physical Exam Vitals and nursing note reviewed.  Constitutional:      Appearance: Normal appearance.  HENT:     Head: Normocephalic.     Mouth/Throat:     Mouth: Mucous membranes are moist.  Eyes:     Conjunctiva/sclera: Conjunctivae normal.  Cardiovascular:     Rate and Rhythm: Normal rate and regular rhythm.  Pulmonary:     Effort: Pulmonary effort is normal.  Musculoskeletal:     Right lower leg: No edema.     Left lower leg: No edema.     Comments: No point bony tenderness.  Bilateral lower lumbar paraspinal muscular  tenderness.  Skin:    General: Skin is dry.     Findings: No rash.  Neurological:     Mental Status: He is alert.     Sensory: No sensory deficit.     Motor: No weakness.     Coordination: Coordination normal.     Gait: Gait normal.     Deep Tendon Reflexes: Reflexes normal.  Psychiatric:        Mood and Affect: Mood normal.     ED Results / Procedures / Treatments   Labs (all labs ordered are listed, but only abnormal results are displayed) Labs Reviewed - No data to display  EKG None  Radiology No results found.  Procedures Procedures (including critical care time)  Medications Ordered in ED Medications - No data to display  ED Course  I have reviewed the triage vital signs and the nursing notes.  Pertinent labs & imaging results that were available during my care of the patient were reviewed by me and considered in my medical decision making (see chart for details).  Clinical Course as of Oct 19 1546  [name] Oct 20, 2019  1547 Patient with musculoskeletal back pain.  History of this in the past.  No inciting injury or event.  No bony tenderness or red flag symptoms including no saddle anesthesia, radicular symptoms.  Advised patient conservative treatment such as rest, ice, heat and stretching.  I will give a trial of Flexeril and naproxen.  He is scheduled to follow-up with the [facility] for further evaluation and treatment.   [KM]    Clinical Course User Index [KM] Kristine Royal   MDM Rules/Calculators/A&P                      Based on review of vitals, medical screening exam, lab work and/or imaging, there does not appear to be an acute, emergent etiology for the patient's symptoms. Counseled pt on good return precautions and encouraged both PCP and ED follow-up as needed.  Prior to discharge, I also discussed incidental imaging findings with patient in detail and advised appropriate, recommended follow-up in detail.  Clinical Impression: 1. Acute bilateral  low back pain without sciatica     Disposition: Discharge  Prior to providing a prescription for a controlled substance, I independently reviewed the patient's recent prescription history on the [facility]. The patient had no recent or regular prescriptions and was deemed appropriate for a brief, less than 3 day prescription of narcotic for acute analgesia.  This note was prepared with assistance of Systems analyst. Occasional wrong-word or sound-a-like substitutions may  have occurred due to the inherent limitations of voice recognition software.  Final Clinical Impression(s) / ED Diagnoses Final diagnoses:  Acute bilateral low back pain without sciatica    Rx / DC Orders ED Discharge Orders         Ordered    naproxen (NAPROSYN) 500 MG tablet  2 times daily     10/20/19 1548    cyclobenzaprine (FLEXERIL) 10 MG tablet  2 times daily PRN     10/20/19 1548           Jeral Pinch 10/20/19 1548    Terrilee Files, MD 10/21/19 [phone]

## 2019-10-20 NOTE — Discharge Instructions (Signed)
Thank you for allowing me to care for you today. Please return to the emergency department if you have new or worsening symptoms. Take your medications as instructed.  ° °

## 2020-01-29 IMAGING — CR CHEST - 2 VIEW
2 series · 2 of 2 positions shown · non-contrast
Comparison: January 09, 2019.

CLINICAL DATA: Chest

EXAM:
CHEST - 2 VIEW

[w chest pa]
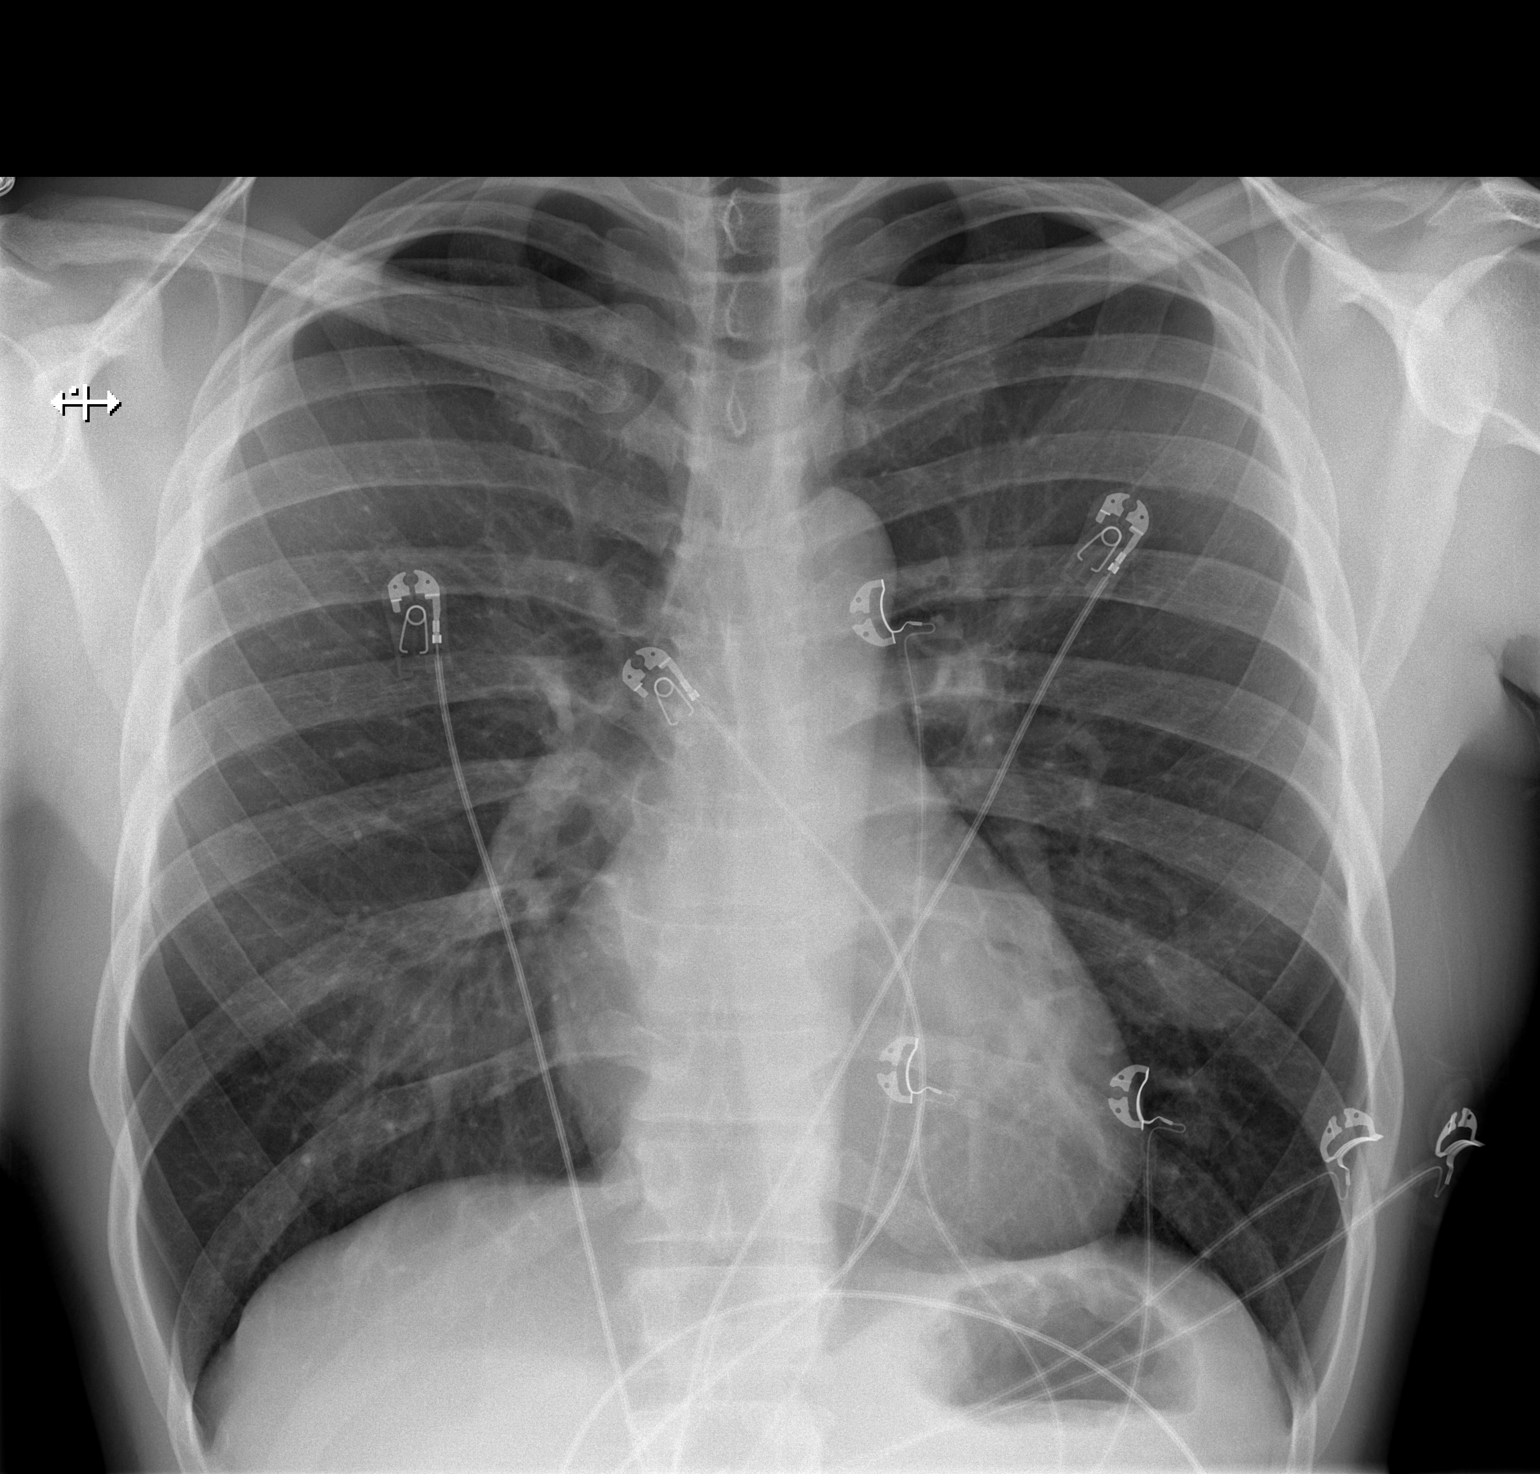

[w chest lat]
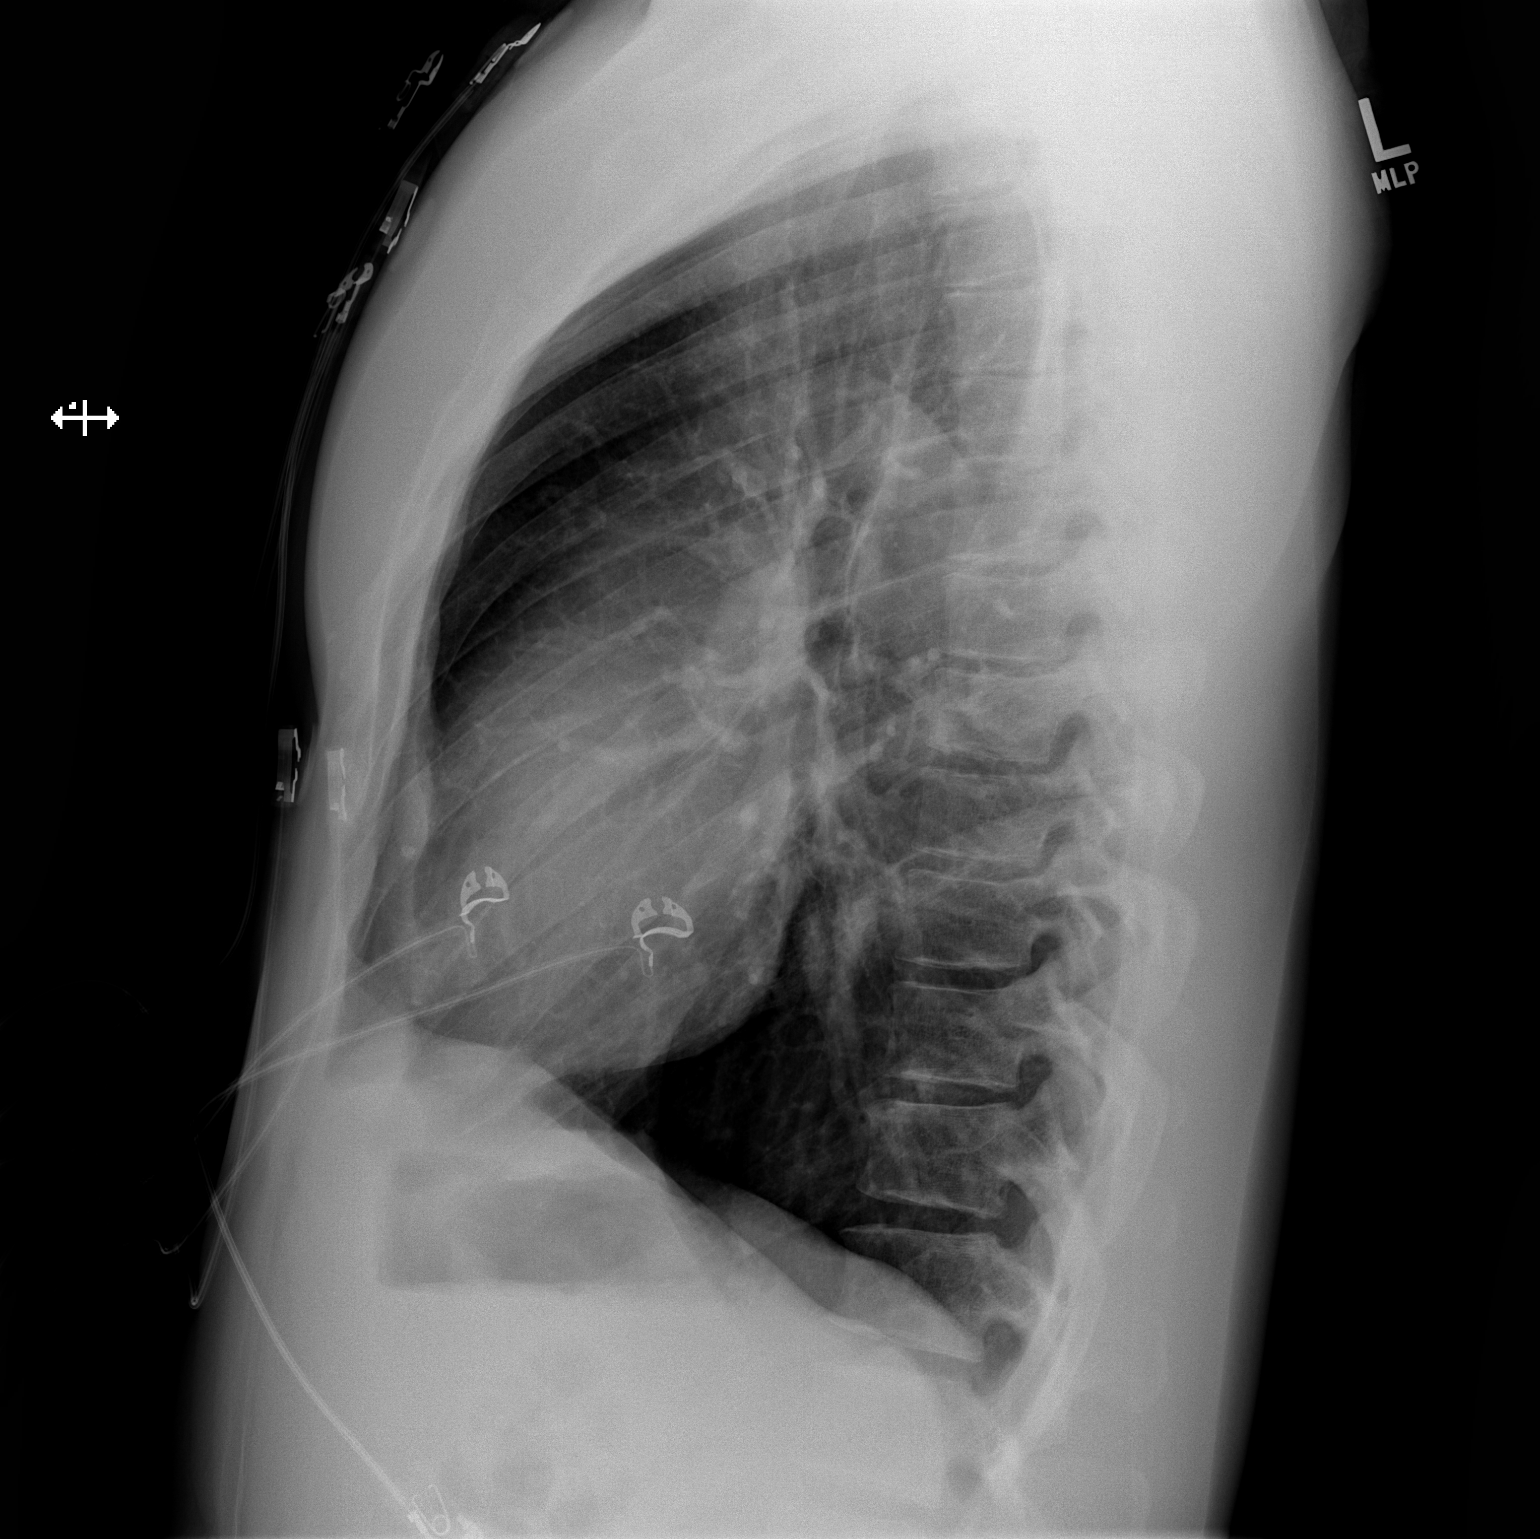

[2 of 2 positions shown; findings below may reference images not displayed]

FINDINGS: Lungs are clear. Heart size and pulmonary vascularity are normal. No
adenopathy. No pneumothorax. No bone lesions.
IMPRESSION: No edema or consolidation.
